# Patient Record
Sex: Female | Born: 1960 | Race: White | Hispanic: No | Marital: Single | Smoking: Former smoker
Health system: Southern US, Community
[De-identification: ages and names within clinical notes are randomized; demographics above are authoritative.]

## PROBLEM LIST (undated history)

## (undated) HISTORY — PX: SINUS EXPLORATION: SHX5214

## (undated) HISTORY — PX: ABDOMINAL HYSTERECTOMY: SHX81

---

## 1998-02-04 ENCOUNTER — Ambulatory Visit (HOSPITAL_COMMUNITY): Admission: RE | Admit: 1998-02-04 | Discharge: 1998-02-04 | Payer: Self-pay

## 2006-05-10 ENCOUNTER — Emergency Department (HOSPITAL_COMMUNITY): Admission: EM | Admit: 2006-05-10 | Discharge: 2006-05-10 | Payer: Self-pay | Admitting: Emergency Medicine

## 2007-01-12 ENCOUNTER — Emergency Department (HOSPITAL_COMMUNITY): Admission: EM | Admit: 2007-01-12 | Discharge: 2007-01-13 | Payer: Self-pay | Admitting: Emergency Medicine

## 2014-05-09 ENCOUNTER — Ambulatory Visit (INDEPENDENT_AMBULATORY_CARE_PROVIDER_SITE_OTHER): Payer: 59

## 2014-05-09 ENCOUNTER — Ambulatory Visit (INDEPENDENT_AMBULATORY_CARE_PROVIDER_SITE_OTHER): Payer: 59 | Admitting: Family Medicine

## 2014-05-09 ENCOUNTER — Encounter (INDEPENDENT_AMBULATORY_CARE_PROVIDER_SITE_OTHER): Payer: Self-pay

## 2014-05-09 VITALS — BP 108/69 | HR 80 | Temp 97.7°F | Ht 61.5 in | Wt 165.0 lb

## 2014-05-09 DIAGNOSIS — S62609A Fracture of unspecified phalanx of unspecified finger, initial encounter for closed fracture: Secondary | ICD-10-CM

## 2014-05-09 DIAGNOSIS — M79609 Pain in unspecified limb: Secondary | ICD-10-CM

## 2014-05-09 DIAGNOSIS — M79641 Pain in right hand: Secondary | ICD-10-CM

## 2014-05-09 DIAGNOSIS — S62639A Displaced fracture of distal phalanx of unspecified finger, initial encounter for closed fracture: Secondary | ICD-10-CM

## 2014-05-09 MED ORDER — ONDANSETRON 8 MG PO TBDP: 8.0000 mg | ORAL_TABLET | Freq: Three times a day (TID) | ORAL | Status: DC | PRN

## 2014-05-09 MED ORDER — HYDROCODONE-ACETAMINOPHEN 5-325 MG PO TABS: 1.0000 | ORAL_TABLET | Freq: Four times a day (QID) | ORAL | Status: DC | PRN

## 2014-05-09 NOTE — Progress Notes (Signed)
   Subjective:    Patient ID: Sheri PancoastKaren Mason, female    DOB: 1960-11-27, 53 y.o.   MRN: [id number]  HPI This 53 y.o. female presents for evaluation of right hand injury.  She was using a power tool to Remove paint from her house and it got caught in her clothing and injured her right 5th finger.   Review of Systems C/o right hand pain No chest pain, SOB, HA, dizziness, vision change, N/V, diarrhea, constipation, dysuria, urinary urgency or frequency,  or rash.     Objective:   Physical Exam Vital signs noted  Well developed well nourished female.  HEENT - Head atraumatic Normocephalic Respiratory - Lungs CTA bilateral Cardiac - RRR S1 and S2 without murmur Right hand - ecchymosis and swelling right 5th finger and hand  Xray of right hand - transverse fx of 5th MIP  Prelimnary reading by Angeline SlimWilliam Oxford,FNP     Assessment & Plan:  Right hand pain - Plan: DG Hand Complete Right, Ambulatory referral to Orthopedic Surgery, ondansetron (ZOFRAN ODT) 8 MG disintegrating tablet, HYDROcodone-acetaminophen (NORCO) 5-325 MG per tablet  Right 5th finger fracture - Refer to orthopedics ASAP and appointment made for 1330 with [facility].  Deatra CanterWilliam J Oxford FNP

## 2014-12-22 ENCOUNTER — Telehealth: Payer: Self-pay | Admitting: Family Medicine

## 2014-12-22 NOTE — Telephone Encounter (Signed)
Patient aware that at this point the tamiflu will not help at this point. Continue to push fluids, use tylenol or ibuprofen for body aches and fever and to call us back if they do not improve.

## 2015-08-25 ENCOUNTER — Encounter: Payer: Self-pay | Admitting: Family Medicine

## 2015-08-25 ENCOUNTER — Ambulatory Visit (INDEPENDENT_AMBULATORY_CARE_PROVIDER_SITE_OTHER): Payer: 59 | Admitting: Family Medicine

## 2015-08-25 VITALS — BP 141/90 | HR 135 | Temp 99.4°F | Ht 61.5 in | Wt 165.0 lb

## 2015-08-25 DIAGNOSIS — R55 Syncope and collapse: Secondary | ICD-10-CM

## 2015-08-25 DIAGNOSIS — R05 Cough: Secondary | ICD-10-CM | POA: Diagnosis not present

## 2015-08-25 DIAGNOSIS — J40 Bronchitis, not specified as acute or chronic: Secondary | ICD-10-CM | POA: Diagnosis not present

## 2015-08-25 DIAGNOSIS — N390 Urinary tract infection, site not specified: Secondary | ICD-10-CM

## 2015-08-25 DIAGNOSIS — R509 Fever, unspecified: Secondary | ICD-10-CM

## 2015-08-25 DIAGNOSIS — J209 Acute bronchitis, unspecified: Secondary | ICD-10-CM

## 2015-08-25 DIAGNOSIS — R059 Cough, unspecified: Secondary | ICD-10-CM

## 2015-08-25 DIAGNOSIS — R062 Wheezing: Secondary | ICD-10-CM | POA: Diagnosis not present

## 2015-08-25 DIAGNOSIS — R319 Hematuria, unspecified: Secondary | ICD-10-CM | POA: Diagnosis not present

## 2015-08-25 LAB — POCT UA - MICROSCOPIC ONLY
Bacteria, U Microscopic: NEGATIVE
CRYSTALS, UR, HPF, POC: NEGATIVE
Casts, Ur, LPF, POC: NEGATIVE
Mucus, UA: NEGATIVE

## 2015-08-25 LAB — POCT URINALYSIS DIPSTICK
Bilirubin, UA: NEGATIVE
GLUCOSE UA: NEGATIVE
Ketones, UA: NEGATIVE
NITRITE UA: NEGATIVE
PH UA: 6
Protein, UA: NEGATIVE
SPEC GRAV UA: 1.01
Urobilinogen, UA: NEGATIVE

## 2015-08-25 LAB — POCT INFLUENZA A/B
INFLUENZA A, POC: NEGATIVE
INFLUENZA B, POC: NEGATIVE

## 2015-08-25 LAB — POCT RAPID STREP A (OFFICE): Rapid Strep A Screen: NEGATIVE

## 2015-08-25 MED ORDER — ALBUTEROL SULFATE HFA 108 (90 BASE) MCG/ACT IN AERS: 2.0000 | INHALATION_SPRAY | Freq: Four times a day (QID) | RESPIRATORY_TRACT | Status: DC | PRN

## 2015-08-25 MED ORDER — PREDNISONE 10 MG PO TABS: ORAL_TABLET | ORAL | Status: DC

## 2015-08-25 MED ORDER — HYDROCOD POLST-CPM POLST ER 10-8 MG/5ML PO SUER: ORAL | Status: DC

## 2015-08-25 MED ORDER — METHYLPREDNISOLONE ACETATE 80 MG/ML IJ SUSP
60.0000 mg | Freq: Once | INTRAMUSCULAR | Status: AC
  Administered 2015-08-25: 60 mg via INTRAMUSCULAR

## 2015-08-25 MED ORDER — TRIAMCINOLONE ACETONIDE 0.1 % EX CREA: 1.0000 "application " | TOPICAL_CREAM | Freq: Two times a day (BID) | CUTANEOUS | Status: DC

## 2015-08-25 MED ORDER — SULFAMETHOXAZOLE-TRIMETHOPRIM 800-160 MG PO TABS: ORAL_TABLET | ORAL | Status: DC

## 2015-08-25 NOTE — Progress Notes (Signed)
Subjective:    Patient ID: Sheri Mason, female    DOB: May 27, 1961, 54 y.o.   MRN: [id number]  HPI  Patient here tonight for cough, sore throat, fainting spell and fever. This all started on Saturday which was 3 days ago. The patient has yellow congestion and drainage along with fatigue. The fainting spell was this morning. The patient comes to the visit with her son. She has trouble talking and taking deep breaths without coughing. She is not having any urinary tract or gastrointestinal symptoms.       There are no active problems to display for this patient.  Outpatient Encounter Prescriptions as of 08/25/2015  Medication Sig  . [DISCONTINUED] HYDROcodone-acetaminophen (NORCO) 5-325 MG per tablet Take 1 tablet by mouth every 6 (six) hours as needed for moderate pain.  . [DISCONTINUED] ondansetron (ZOFRAN ODT) 8 MG disintegrating tablet Take 1 tablet (8 mg total) by mouth every 8 (eight) hours as needed for nausea or vomiting.  . [DISCONTINUED] phentermine 37.5 MG capsule Take 37.5 mg by mouth every morning.   No facility-administered encounter medications on file as of 08/25/2015.     Review of Systems  Constitutional: Positive for fever and fatigue.  HENT: Positive for congestion (yellow), postnasal drip and sore throat.   Eyes: Negative.   Respiratory: Positive for cough.   Cardiovascular: Negative.   Gastrointestinal: Negative.   Endocrine: Negative.   Genitourinary: Negative.   Musculoskeletal: Negative.   Skin: Negative.   Allergic/Immunologic: Negative.   Neurological: Negative.        Fainting spell this morning  Hematological: Negative.   Psychiatric/Behavioral: Negative.        Objective:   Physical Exam  Constitutional: She is oriented to person, place, and time. She appears well-developed and well-nourished. She appears distressed.  HENT:  Head: Normocephalic and atraumatic.  Right Ear: External ear normal.  Left Ear: External ear normal.  Mouth/Throat:  Oropharynx is clear and moist.  Maxillary sinus tenderness  Eyes: Conjunctivae and EOM are normal. Pupils are equal, round, and reactive to light. Right eye exhibits no discharge. Left eye exhibits no discharge. No scleral icterus.  Neck: Normal range of motion. Neck supple. No thyromegaly present.  No anterior cervical adenopathy  Cardiovascular: Normal rate, regular rhythm and normal heart sounds.   No murmur heard. 96/m with a regular rate and rhythm  Pulmonary/Chest: Effort normal. No respiratory distress. She has wheezes. She has no rales. She exhibits no tenderness.  Inspiratory and expiratory wheezing and rhonchi  Abdominal: Soft. Bowel sounds are normal. She exhibits no mass. There is no tenderness. There is no rebound and no guarding.  Musculoskeletal: Normal range of motion. She exhibits no edema.  Lymphadenopathy:    She has no cervical adenopathy.  Neurological: She is alert and oriented to person, place, and time.  Skin: Skin is warm and dry. No rash noted.  Psychiatric: She has a normal mood and affect. Her behavior is normal. Judgment and thought content normal.  Nursing note and vitals reviewed.   BP 141/90 mmHg  Pulse 135  Temp(Src) 99.4 F (37.4 C) (Oral)  Ht 5' 1.5" (1.562 m)  Wt 165 lb (74.844 kg)  BMI 30.68 kg/m2  Results for orders placed or performed in visit on 08/25/15  POCT urinalysis dipstick  Result Value Ref Range   Color, UA yellow    Clarity, UA cloudy    Glucose, UA neg    Bilirubin, UA neg    Ketones, UA neg  Spec Grav, UA 1.010    Blood, UA large    pH, UA 6.0    Protein, UA neg    Urobilinogen, UA negative    Nitrite, UA neg    Leukocytes, UA large (3+) (A) Negative  POCT UA - Microscopic Only  Result Value Ref Range   WBC, Ur, HPF, POC 25-30    RBC, urine, microscopic 5-10    Bacteria, U Microscopic neg    Mucus, UA neg    Epithelial cells, urine per micros occ    Crystals, Ur, HPF, POC neg    Casts, Ur, LPF, POC neg    Yeast,  UA mod   POCT Influenza A/B  Result Value Ref Range   Influenza A, POC Negative Negative   Influenza B, POC Negative Negative  POCT rapid strep A  Result Value Ref Range   Rapid Strep A Screen Negative Negative   The patient was informed of the above results before she left the office     Assessment & Plan:  1. Fainting spell -Drink plenty of fluids and stay well hydrated and get plenty of rest - BMP8+EGFR - CBC with Differential/Platelet - Hepatic function panel - POCT urinalysis dipstick - POCT UA - Microscopic Only - Urine culture  2. Fever, unspecified fever cause -Take Tylenol for aches pains and fever - BMP8+EGFR - CBC with Differential/Platelet - Hepatic function panel - POCT urinalysis dipstick - POCT UA - Microscopic Only - POCT Influenza A/B - POCT rapid strep A - Culture, Group A Strep - Urine culture - sulfamethoxazole-trimethoprim (BACTRIM DS,SEPTRA DS) 800-160 MG tablet; 1 tablet twice daily with food for infection until completed  Dispense: 20 tablet; Refill: 0  3. Bronchitis with bronchospasm -Use inhaler 1-2 puffs 4 times daily as needed for wheezing and chest tightness -Continue to take Mucinex maximum strength, blue and white in color, 1 twice daily for cough and congestion with a large glass of water - sulfamethoxazole-trimethoprim (BACTRIM DS,SEPTRA DS) 800-160 MG tablet; 1 tablet twice daily with food for infection until completed  Dispense: 20 tablet; Refill: 0  4. Wheezing -Drink plenty of fluids and stay well hydrated and use inhaler as directed - albuterol (PROVENTIL HFA;VENTOLIN HFA) 108 (90 BASE) MCG/ACT inhaler; Inhale 2 puffs into the lungs every 6 (six) hours as needed for wheezing or shortness of breath.  Dispense: 1 Inhaler; Refill: 2 - predniSONE (DELTASONE) 10 MG tablet; 1 tablet 4 times a day for 2 days,  1 tablet 3 times a day for 2 days,  1 tablet 2 times a day for 2 days, 1 tablet daily for 2 days  Dispense: 20 tablet; Refill: 0 -  methylPREDNISolone acetate (DEPO-MEDROL) injection 60 mg; Inject 0.75 mLs (60 mg total) into the muscle once.  5. Cough -Take Mucinex and use a stronger cough medicine for nighttime use if needed for severe cough - chlorpheniramine-HYDROcodone (TUSSIONEX PENNKINETIC ER) 10-8 MG/5ML SUER; 1 teaspoon at bedtime if needed for severe cough  Dispense: 100 mL; Refill: 0  6. Urinary tract infection with hematuria, site unspecified -Take antibiotic as directed and drink plenty of fluids - POCT urinalysis dipstick - POCT UA - Microscopic Only  Patient Instructions  Take antibiotic as directed with food Drink plenty of fluids and stay well hydrated Take Tylenol for aches pains and fever Take prednisone as directed Take Mucinex maximum strength blue and white in color, 1 twice daily for cough and congestion with a large glass of water Use nasal saline frequently in each  nostril Take stronger cough medicine at nighttime if needed for severe cough Use inhaler as needed for wheezing   Arrie Senate MD

## 2015-08-25 NOTE — Patient Instructions (Signed)
Take antibiotic as directed with food Drink plenty of fluids and stay well hydrated Take Tylenol for aches pains and fever Take prednisone as directed Take Mucinex maximum strength blue and white in color, 1 twice daily for cough and congestion with a large glass of water Use nasal saline frequently in each nostril Take stronger cough medicine at nighttime if needed for severe cough Use inhaler as needed for wheezing

## 2015-08-26 LAB — CBC WITH DIFFERENTIAL/PLATELET
BASOS ABS: 0 10*3/uL (ref 0.0–0.2)
BASOS: 0 %
EOS (ABSOLUTE): 0.1 10*3/uL (ref 0.0–0.4)
Eos: 2 %
Hematocrit: 41 % (ref 34.0–46.6)
Hemoglobin: 14.1 g/dL (ref 11.1–15.9)
IMMATURE GRANULOCYTES: 0 %
Immature Grans (Abs): 0 10*3/uL (ref 0.0–0.1)
Lymphocytes Absolute: 1.1 10*3/uL (ref 0.7–3.1)
Lymphs: 19 %
MCH: 31 pg (ref 26.6–33.0)
MCHC: 34.4 g/dL (ref 31.5–35.7)
MCV: 90 fL (ref 79–97)
MONOS ABS: 0.4 10*3/uL (ref 0.1–0.9)
Monocytes: 7 %
NEUTROS PCT: 72 %
Neutrophils Absolute: 4.4 10*3/uL (ref 1.4–7.0)
PLATELETS: 206 10*3/uL (ref 150–379)
RBC: 4.55 x10E6/uL (ref 3.77–5.28)
RDW: 13.6 % (ref 12.3–15.4)
WBC: 6 10*3/uL (ref 3.4–10.8)

## 2015-08-26 LAB — HEPATIC FUNCTION PANEL
ALT: 17 IU/L (ref 0–32)
AST: 20 IU/L (ref 0–40)
Albumin: 4.6 g/dL (ref 3.5–5.5)
Alkaline Phosphatase: 95 IU/L (ref 39–117)
BILIRUBIN TOTAL: 0.4 mg/dL (ref 0.0–1.2)
Bilirubin, Direct: 0.1 mg/dL (ref 0.00–0.40)
Total Protein: 7.3 g/dL (ref 6.0–8.5)

## 2015-08-26 LAB — BMP8+EGFR
BUN/Creatinine Ratio: 15 (ref 9–23)
BUN: 9 mg/dL (ref 6–24)
CHLORIDE: 99 mmol/L (ref 97–106)
CO2: 23 mmol/L (ref 18–29)
Calcium: 9.5 mg/dL (ref 8.7–10.2)
Creatinine, Ser: 0.62 mg/dL (ref 0.57–1.00)
GFR calc Af Amer: 118 mL/min/{1.73_m2} (ref >59)
GFR calc non Af Amer: 103 mL/min/{1.73_m2} (ref >59)
GLUCOSE: 100 mg/dL — AB (ref 65–99)
POTASSIUM: 3.9 mmol/L (ref 3.5–5.2)
SODIUM: 140 mmol/L (ref 136–144)

## 2015-08-27 LAB — CULTURE, GROUP A STREP: Strep A Culture: NEGATIVE

## 2015-08-27 LAB — URINE CULTURE

## 2015-08-28 NOTE — Progress Notes (Signed)
Lmtcb/ww 11/18  

## 2015-09-01 ENCOUNTER — Ambulatory Visit (INDEPENDENT_AMBULATORY_CARE_PROVIDER_SITE_OTHER): Payer: 59 | Admitting: Family Medicine

## 2015-09-01 ENCOUNTER — Encounter: Payer: Self-pay | Admitting: Family Medicine

## 2015-09-01 ENCOUNTER — Ambulatory Visit (INDEPENDENT_AMBULATORY_CARE_PROVIDER_SITE_OTHER): Payer: 59

## 2015-09-01 VITALS — BP 124/75 | HR 78 | Temp 99.1°F | Ht 61.5 in | Wt 167.0 lb

## 2015-09-01 DIAGNOSIS — R509 Fever, unspecified: Secondary | ICD-10-CM

## 2015-09-01 DIAGNOSIS — R300 Dysuria: Secondary | ICD-10-CM

## 2015-09-01 DIAGNOSIS — R059 Cough, unspecified: Secondary | ICD-10-CM

## 2015-09-01 DIAGNOSIS — R05 Cough: Secondary | ICD-10-CM

## 2015-09-01 DIAGNOSIS — R8281 Pyuria: Secondary | ICD-10-CM

## 2015-09-01 DIAGNOSIS — J209 Acute bronchitis, unspecified: Secondary | ICD-10-CM

## 2015-09-01 DIAGNOSIS — R062 Wheezing: Secondary | ICD-10-CM | POA: Diagnosis not present

## 2015-09-01 DIAGNOSIS — R1030 Lower abdominal pain, unspecified: Secondary | ICD-10-CM

## 2015-09-01 DIAGNOSIS — J40 Bronchitis, not specified as acute or chronic: Secondary | ICD-10-CM | POA: Diagnosis not present

## 2015-09-01 DIAGNOSIS — R319 Hematuria, unspecified: Secondary | ICD-10-CM | POA: Diagnosis not present

## 2015-09-01 DIAGNOSIS — N390 Urinary tract infection, site not specified: Secondary | ICD-10-CM | POA: Diagnosis not present

## 2015-09-01 LAB — POCT URINALYSIS DIPSTICK
BILIRUBIN UA: NEGATIVE
GLUCOSE UA: NEGATIVE
KETONES UA: NEGATIVE
Leukocytes, UA: NEGATIVE
Nitrite, UA: NEGATIVE
Protein, UA: NEGATIVE
RBC UA: NEGATIVE
UROBILINOGEN UA: NEGATIVE
pH, UA: 5

## 2015-09-01 LAB — POCT UA - MICROSCOPIC ONLY
BACTERIA, U MICROSCOPIC: NEGATIVE
CASTS, UR, LPF, POC: NEGATIVE
CRYSTALS, UR, HPF, POC: NEGATIVE
MUCUS UA: NEGATIVE
RBC, URINE, MICROSCOPIC: NEGATIVE
WBC, Ur, HPF, POC: NEGATIVE

## 2015-09-01 MED ORDER — HYDROCOD POLST-CPM POLST ER 10-8 MG/5ML PO SUER: ORAL | Status: DC

## 2015-09-01 MED ORDER — LEVOFLOXACIN 500 MG PO TABS: 500.0000 mg | ORAL_TABLET | Freq: Every day | ORAL | Status: DC

## 2015-09-01 NOTE — Progress Notes (Signed)
Subjective:    Patient ID: Sheri PancoastKaren Mason, female    DOB: 01/30/1961, 54 y.o.   MRN: [id number]  HPI Patient here today for lingering cough but getting better. The patient is finishing her prednisone and still taking her Septra DS. She is still coughing and wheezing and she did get the prescription for stronger cough medicine to take at nighttime. She is actually having more suprapubic pressure that she was having the night that she was seen. All of her recent lab work was reviewed with her including a normal white blood cell count the urinalysis that had 25-30 WBC and the urine culture that showed mixed bacterial flora with no specific bacteria to treat. She did not come back to get the chest x-ray as was planned because of the expense with all the medicines. She says if she is coughing up some sputum still haven't may have slightly yellow tinge.  There are no active problems to display for this patient.  Outpatient Encounter Prescriptions as of 09/01/2015  Medication Sig  . albuterol (PROVENTIL HFA;VENTOLIN HFA) 108 (90 BASE) MCG/ACT inhaler Inhale 2 puffs into the lungs every 6 (six) hours as needed for wheezing or shortness of breath.  . chlorpheniramine-HYDROcodone (TUSSIONEX PENNKINETIC ER) 10-8 MG/5ML SUER 1 teaspoon at bedtime if needed for severe cough  . predniSONE (DELTASONE) 10 MG tablet 1 tablet 4 times a day for 2 days,  1 tablet 3 times a day for 2 days,  1 tablet 2 times a day for 2 days, 1 tablet daily for 2 days  . sulfamethoxazole-trimethoprim (BACTRIM DS,SEPTRA DS) 800-160 MG tablet 1 tablet twice daily with food for infection until completed  . triamcinolone cream (KENALOG) 0.1 % Apply 1 application topically 2 (two) times daily.   No facility-administered encounter medications on file as of 09/01/2015.       Review of Systems  Constitutional: Negative.   HENT: Negative.   Eyes: Negative.   Respiratory: Positive for cough (lingering cough, but better).     Cardiovascular: Negative.   Gastrointestinal: Negative.   Endocrine: Negative.   Genitourinary: Negative.   Musculoskeletal: Negative.   Skin: Negative.   Allergic/Immunologic: Negative.   Neurological: Negative.   Hematological: Negative.   Psychiatric/Behavioral: Negative.          Objective:   Physical Exam  Constitutional: She is oriented to person, place, and time. She appears well-developed and well-nourished. No distress.  HENT:  Head: Normocephalic and atraumatic.  Right Ear: External ear normal.  Left Ear: External ear normal.  Nose: Nose normal.  Mouth/Throat: No oropharyngeal exudate.  The throat is slightly red posteriorly  Eyes: Conjunctivae and EOM are normal. Pupils are equal, round, and reactive to light. Right eye exhibits no discharge. Left eye exhibits no discharge. No scleral icterus.  Neck: Normal range of motion. Neck supple. No JVD present. No thyromegaly present.  Cardiovascular: Normal rate, regular rhythm and normal heart sounds.   No murmur heard. Pulmonary/Chest: Effort normal. No respiratory distress. She has wheezes. She has no rales. She exhibits no tenderness.  The patient cannot inhale deeply without coughing. She has a raspy cough and wheezes on expiration.  Abdominal: Soft. Bowel sounds are normal. She exhibits no mass. There is no tenderness. There is no rebound and no guarding.  There is minimal suprapubic tenderness with normal bowel sounds and no bruits and no organ enlargement or masses  Musculoskeletal: Normal range of motion. She exhibits no edema.  Lymphadenopathy:    She has no cervical  adenopathy.  Neurological: She is alert and oriented to person, place, and time.  Skin: Skin is warm and dry. No rash noted.  Psychiatric: She has a normal mood and affect. Her behavior is normal. Judgment and thought content normal.  Nursing note and vitals reviewed.  BP 124/75 mmHg  Pulse 78  Temp(Src) 99.1 F (37.3 C) (Oral)  Ht 5' 1.5"  (1.562 m)  Wt 167 lb (75.751 kg)  BMI 31.05 kg/m2  [facility] reading (PRIMARY) by  Dr. Tracie Harrier x-ray-  according to the radiologist and pleural effusion cannot be excluded or atelectasis.                             Results for orders placed or performed in visit on 09/01/15  POCT UA - Microscopic Only  Result Value Ref Range   WBC, Ur, HPF, POC neg    RBC, urine, microscopic neg    Bacteria, U Microscopic neg    Mucus, UA neg    Epithelial cells, urine per micros occ    Crystals, Ur, HPF, POC neg    Casts, Ur, LPF, POC neg    Yeast, UA moderate   POCT urinalysis dipstick  Result Value Ref Range   Color, UA yellow    Clarity, UA cloudy    Glucose, UA neg    Bilirubin, UA neg    Ketones, UA neg    Spec Grav, UA >=1.030    Blood, UA neg    pH, UA 5.0    Protein, UA neg    Urobilinogen, UA negative    Nitrite, UA neg    Leukocytes, UA Negative Negative          Assessment & Plan:  1. Suprapubic pressure, unspecified laterality -Since the urinalysis was clear today and the patient is not having any overt symptoms with her voiding, she will arrange to see the gynecologist for the possibility of prolapse.  2. Bronchitis with bronchospasm -Discontinue Septra and start Levaquin 500 one daily for 7 days -Finish prednisone -Continue to drink plenty of fluids and use albuterol inhaler as a rescue inhaler and use the Brio inhaler 1 puff daily and rinse mouth after using  3. Pyuria -The urinalysis has cleared  4. Urinary tract infection with hematuria, site unspecified -Urinalysis is clear today even though she continues to have pelvic pressure  5. Dysuria -No symptoms with voiding now - POCT UA - Microscopic Only - POCT urinalysis dipstick  6. Cough -Continue with Mucinex and/or codeine cough medicine if needed for severe cough -Drink plenty of fluids - chlorpheniramine-HYDROcodone (TUSSIONEX PENNKINETIC ER) 10-8 MG/5ML SUER; 1 teaspoon at bedtime if needed for severe  cough  Dispense: 100 mL; Refill: 0  Meds ordered this encounter  Medications  . levofloxacin (LEVAQUIN) 500 MG tablet    Sig: Take 1 tablet (500 mg total) by mouth daily.    Dispense:  7 tablet    Refill:  0  . chlorpheniramine-HYDROcodone (TUSSIONEX PENNKINETIC ER) 10-8 MG/5ML SUER    Sig: 1 teaspoon at bedtime if needed for severe cough    Dispense:  100 mL    Refill:  0   Patient Instructions   Continue to drink plenty of fluids Continue to use rescue inhaler or albuterol as needed Continue to take Mucinex twice daily with a large glass of water for cough Continue to use nasal saline Use stronger cough medicine at nighttime for severe cough  Stop the sulfa  and start Levaquin and Breo inhaler. The patient was also instructed to make an appointment with her gynecologist because of the pelvic pressure that she is experiencing since she has had the cough She will return to the office in one week for recheck to follow-up on the bronchitis and bronchospasm   Nyra Capes MD

## 2015-09-01 NOTE — Patient Instructions (Addendum)
  Continue to drink plenty of fluids Continue to use rescue inhaler or albuterol as needed Continue to take Mucinex twice daily with a large glass of water for cough Continue to use nasal saline Use stronger cough medicine at nighttime for severe cough  Stop the sulfa and start Levaquin and Breo inhaler. The patient was also instructed to make an appointment with her gynecologist because of the pelvic pressure that she is experiencing since she has had the cough She will return to the office in one week for recheck to follow-up on the bronchitis and bronchospasm

## 2015-09-10 ENCOUNTER — Encounter: Payer: Self-pay | Admitting: Family Medicine

## 2015-09-10 ENCOUNTER — Ambulatory Visit (INDEPENDENT_AMBULATORY_CARE_PROVIDER_SITE_OTHER): Payer: 59 | Admitting: Family Medicine

## 2015-09-10 VITALS — BP 124/85 | HR 62 | Temp 97.1°F | Ht 61.5 in | Wt 170.8 lb

## 2015-09-10 DIAGNOSIS — J189 Pneumonia, unspecified organism: Secondary | ICD-10-CM

## 2015-09-10 DIAGNOSIS — R05 Cough: Secondary | ICD-10-CM | POA: Diagnosis not present

## 2015-09-10 DIAGNOSIS — R059 Cough, unspecified: Secondary | ICD-10-CM

## 2015-09-10 MED ORDER — FLUTICASONE FUROATE-VILANTEROL 100-25 MCG/INH IN AEPB: 1.0000 | INHALATION_SPRAY | Freq: Every day | RESPIRATORY_TRACT | Status: DC

## 2015-09-10 MED ORDER — PREDNISONE 20 MG PO TABS: 40.0000 mg | ORAL_TABLET | Freq: Every day | ORAL | Status: DC

## 2015-09-10 MED ORDER — FLUTICASONE PROPIONATE 50 MCG/ACT NA SUSP: 2.0000 | Freq: Every day | NASAL | Status: DC

## 2015-09-10 NOTE — Progress Notes (Signed)
   HPI  Patient presents today for follow-up for treated for pneumonia and cough.  She feels much better overall but states that her cough is still lingering. The cough has improved but is still annoying. She has some wheezing cough and coughing with activities. She has started breo and finished her prednisone a few weeks ago. She's is albuterol occasionally with good improvement of symptoms   PMH: Smoking status noted ROS: Per HPI  Objective: BP 124/85 mmHg  Pulse 62  Temp(Src) 97.1 F (36.2 C) (Oral)  Ht 5' 1.5" (1.562 m)  Wt 170 lb 12.8 oz (77.474 kg)  BMI 31.75 kg/m2 Gen: NAD, alert, cooperative with exam HEENT: NCAT, nares with some swelling of the turbinates and bluish discoloration bilaterally, cobblestoning of the oropharynx, TMs normal bilaterally CV: RRR, good S1/S2, no murmur Resp: Nonlabored, x-ray wheezes throughout Ext: No edema, warm Neuro: Alert and oriented, No gross deficits  Assessment and plan:  # Community-acquired pneumonia, cough She is now finished with her antibiotics and prednisone, she feels much better but her cough is still lingering. She does have wheezing on exam She has improvement with albuterol and breo, continue breo for 2 additional weeks 7 additional days prednisone Return precautions reviewed She may have underlying asthma, so could consider long-term breo or similar medicine if needed   Meds ordered this encounter  Medications  . predniSONE (DELTASONE) 20 MG tablet    Sig: Take 2 tablets (40 mg total) by mouth daily with breakfast.    Dispense:  14 tablet    Refill:  0  . Fluticasone Furoate-Vilanterol (BREO ELLIPTA) 100-25 MCG/INH AEPB    Sig: Inhale 1 puff into the lungs daily.    Dispense:  1 each    Refill:  0  . fluticasone (FLONASE) 50 MCG/ACT nasal spray    Sig: Place 2 sprays into both nostrils daily.    Dispense:  16 g    Refill:  11    Murtis SinkSam Delson [place], MD [facility] 09/10/2015, 12:02  PM

## 2015-09-10 NOTE — Patient Instructions (Signed)
Great to see you again!  Please come back if things get better or do not improve as expected  Finish the breo, if you are better you can stop  Start flonase 2 times daily for 1 week then daily.

## 2016-05-05 ENCOUNTER — Telehealth: Payer: Self-pay | Admitting: Family Medicine

## 2016-05-31 ENCOUNTER — Other Ambulatory Visit: Payer: Self-pay | Admitting: Family Medicine

## 2016-05-31 DIAGNOSIS — G939 Disorder of brain, unspecified: Secondary | ICD-10-CM

## 2016-06-07 ENCOUNTER — Other Ambulatory Visit: Payer: Self-pay

## 2016-06-07 ENCOUNTER — Ambulatory Visit
Admission: RE | Admit: 2016-06-07 | Discharge: 2016-06-07 | Disposition: A | Discharge status: Home / Self Care | Payer: 59 | Source: Ambulatory Visit | Attending: Family Medicine | Admitting: Family Medicine

## 2016-06-07 DIAGNOSIS — G939 Disorder of brain, unspecified: Secondary | ICD-10-CM

## 2016-06-07 MED ORDER — GADOBENATE DIMEGLUMINE 529 MG/ML IV SOLN
15.0000 mL | Freq: Once | INTRAVENOUS | Status: AC | PRN
  Administered 2016-06-07: 15 mL via INTRAVENOUS

## 2016-11-08 IMAGING — CR DG CHEST 2V
2 series · 2 of 2 positions shown · non-contrast
Comparison: 05/10/2006.

CLINICAL DATA: Cough and congestion.

EXAM:
CHEST  2 VIEW

[view not recorded (1 of 2)]
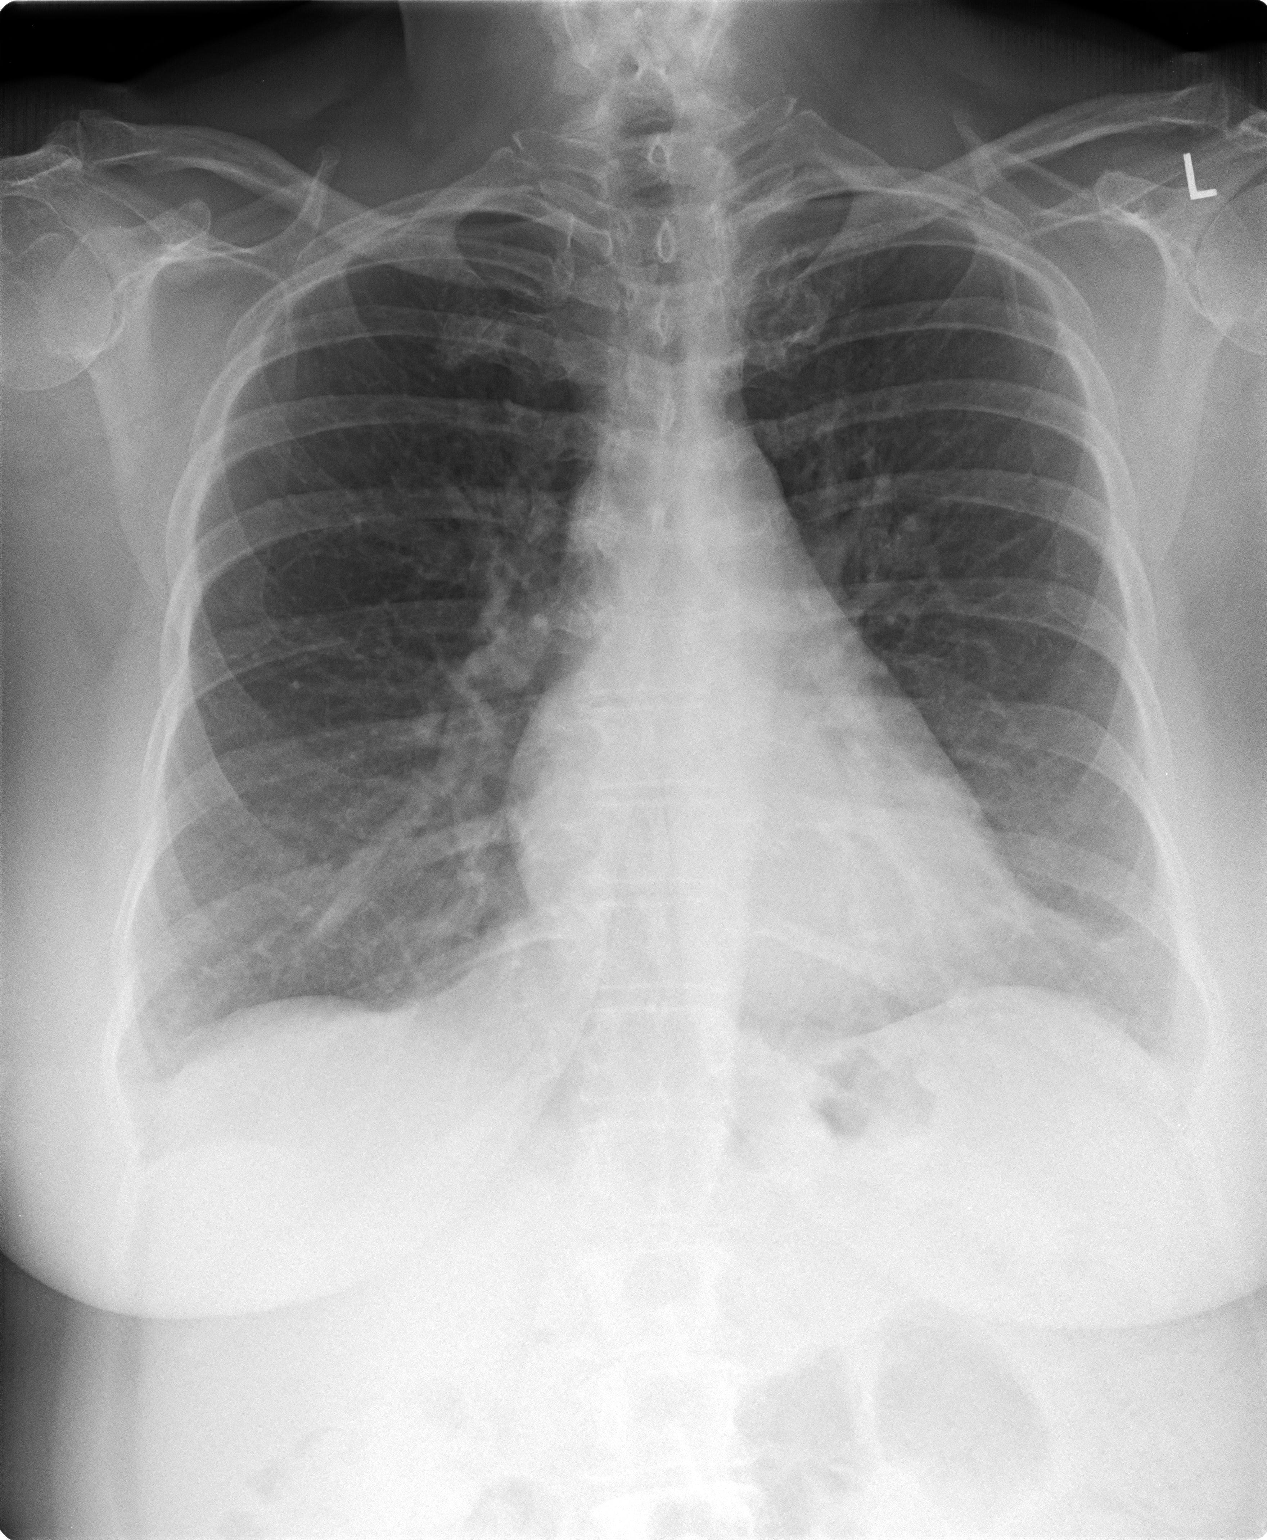

[view not recorded (2 of 2)]
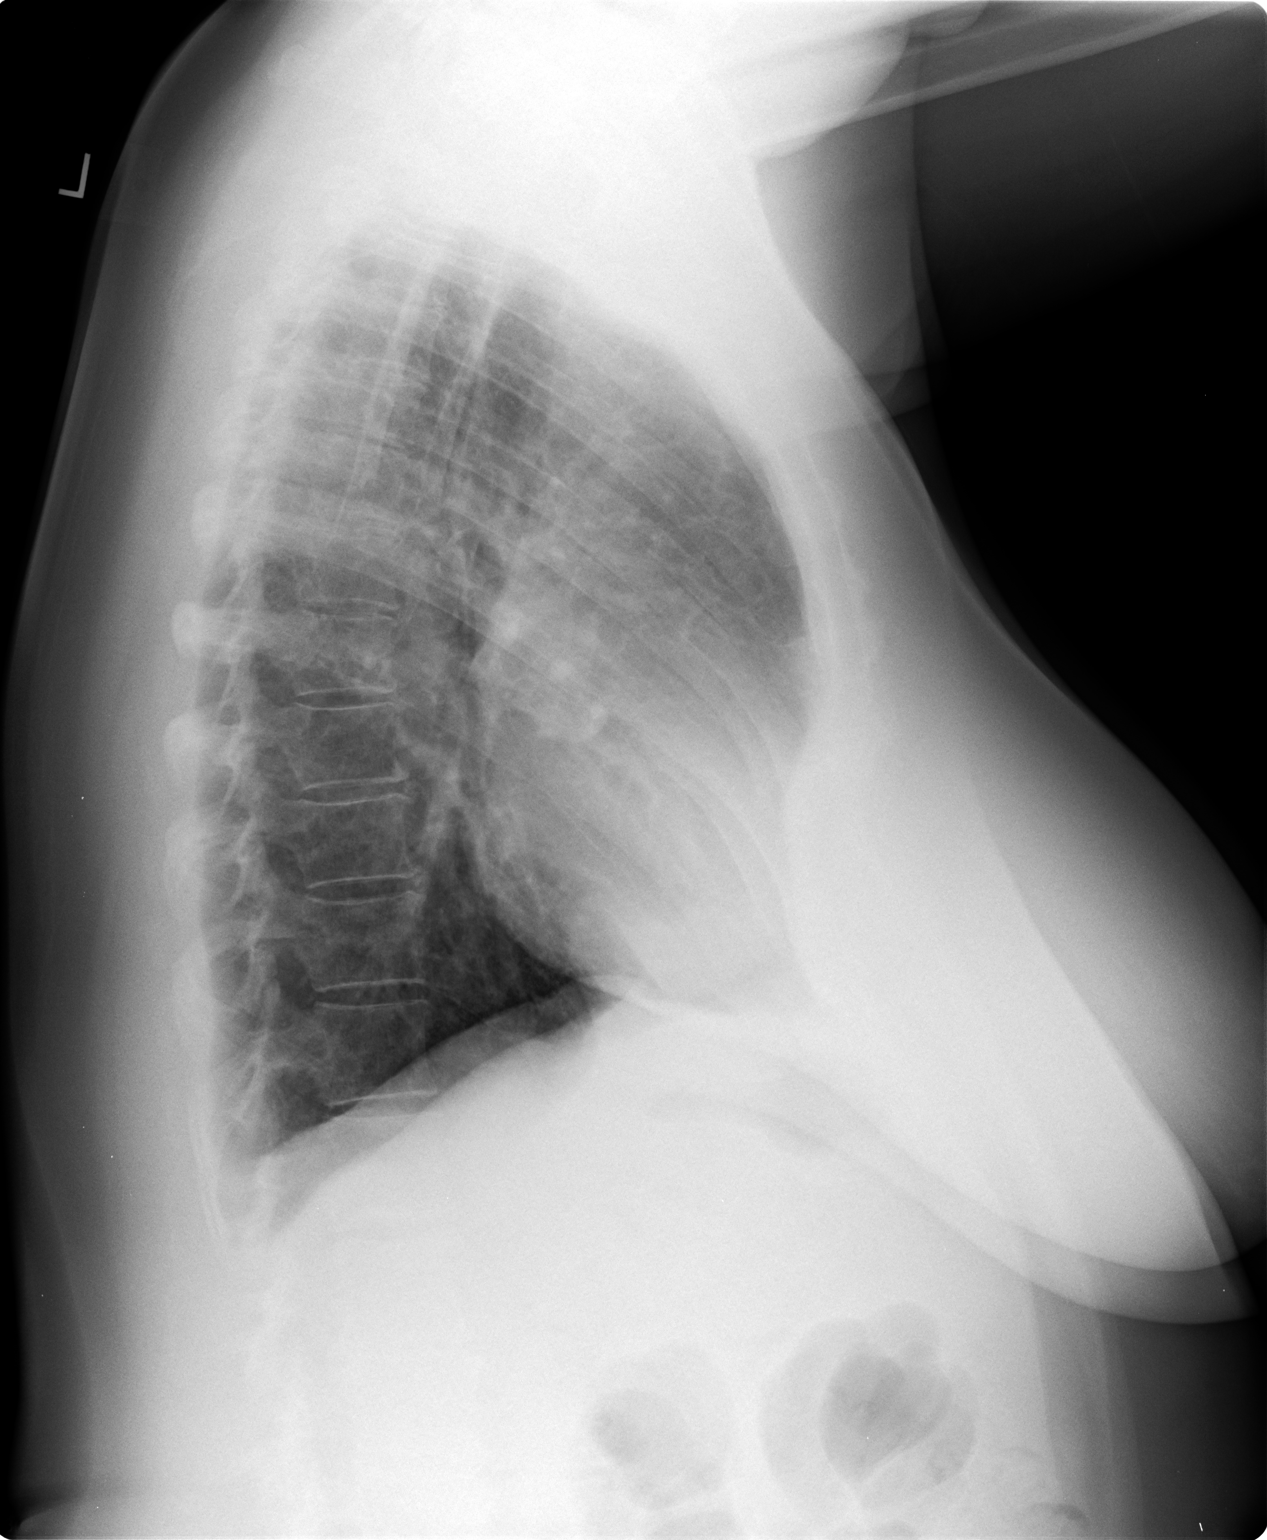

[2 of 2 positions shown; findings below may reference images not displayed]

FINDINGS: Mediastinum and hilar structures normal. Heart size normal.
Subsegmental atelectasis and/or infiltrates are noted in the lung
bases. Small bilateral pleural effusions cannot be excluded .
IMPRESSION: Mild bibasilar subsegmental atelectasis and/or infiltrates with
small pleural effusions cannot be excluded.

## 2017-03-15 ENCOUNTER — Telehealth: Payer: Self-pay | Admitting: Family Medicine

## 2017-08-15 IMAGING — MR MR HEAD WO/W CM
10 of 11 series · 35 of 48 positions shown · IV contrast (15 ML MULTIHANCE)
Comparison: CT head 01/12/2007

CLINICAL DATA: Dizziness, vertigo, and BILATERAL hearing loss,
worse on the LEFT.

EXAM:
MRI HEAD WITHOUT AND WITH CONTRAST
TECHNIQUE: Multiplanar, multiecho pulse sequences of the brain and surrounding
structures were obtained without and with intravenous contrast.
CONTRAST:  15mL MULTIHANCE GADOBENATE DIMEGLUMINE 529 MG/ML IV SOLN

[Series 3: T1 · sagittal · 5.0mm · 0.45mm/px · 4 of 21 slices shown (1 of 3)]
[im 1/21]
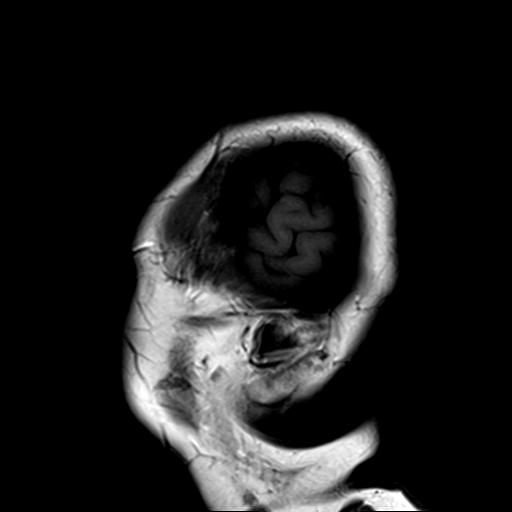
[im 7/21]
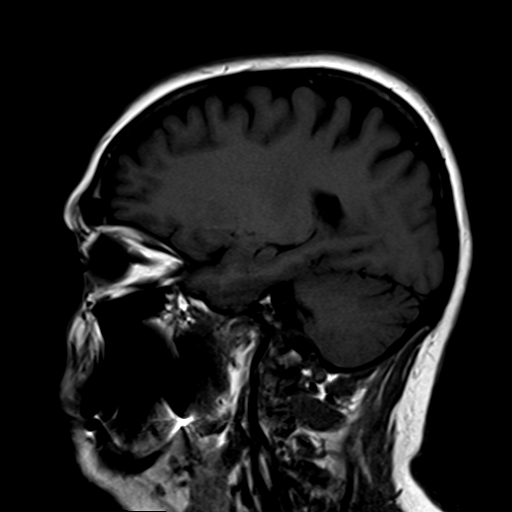
[im 14/21]
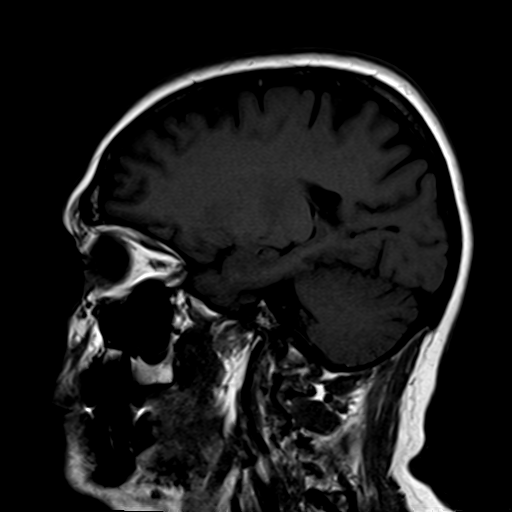
[im 21/21]
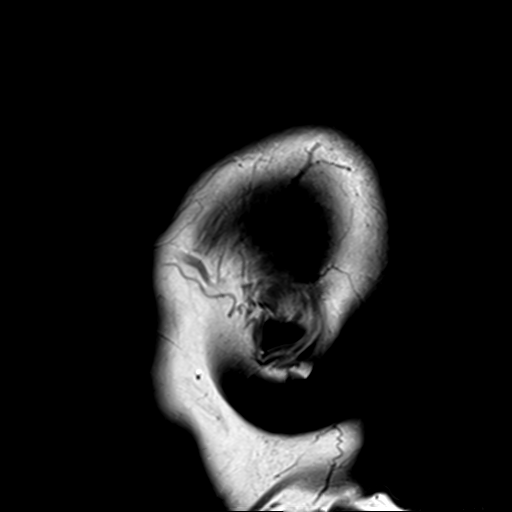

[Series 4: DWI · axial · 3.0mm · 1.80mm/px · z∈[-63,+84]mm · 9 of 100 slices shown (1 of 2)]
[im 1/100]
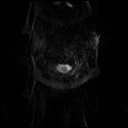
[im 17/100]
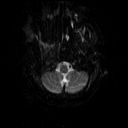
[im 34/100]
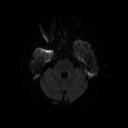
[im 42/100]
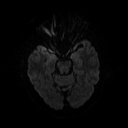
[im 50/100]
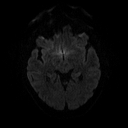
[im 58/100]
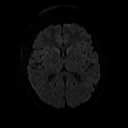
[im 67/100]
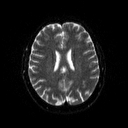
[im 83/100]
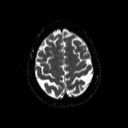
[im 100/100]
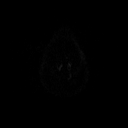

[Series 5: DWI · axial · 3.0mm · 1.80mm/px · z∈[-63,+84]mm · 7 of 50 slices shown (2 of 2)]
[im 1/50]
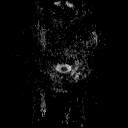
[im 9/50]
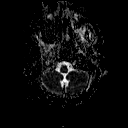
[im 17/50]
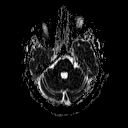
[im 25/50]
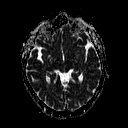
[im 33/50]
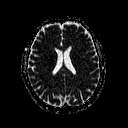
[im 41/50]
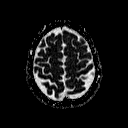
[im 50/50]
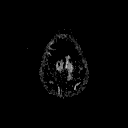

[Series 6: T2 · axial · 5.0mm · 0.60mm/px · z∈[-61,+82]mm · 3 of 23 slices shown]
[im 1/23]
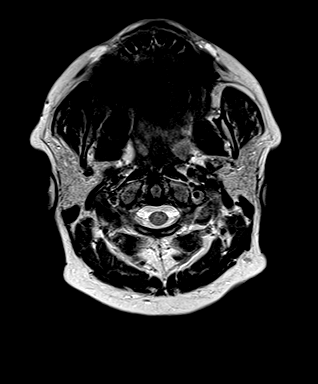
[im 12/23]
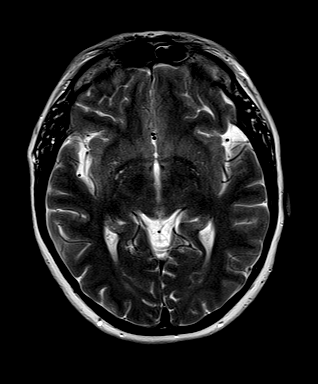
[im 23/23]
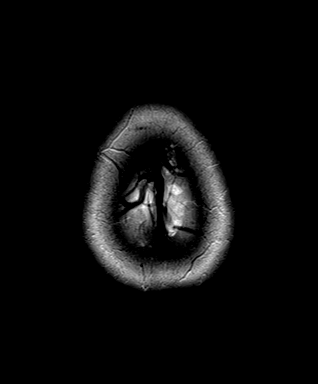

[Series 7: FLAIR · axial · 5.0mm · 0.45mm/px · z∈[-61,+82]mm · 3 of 23 slices shown]
[im 1/23]
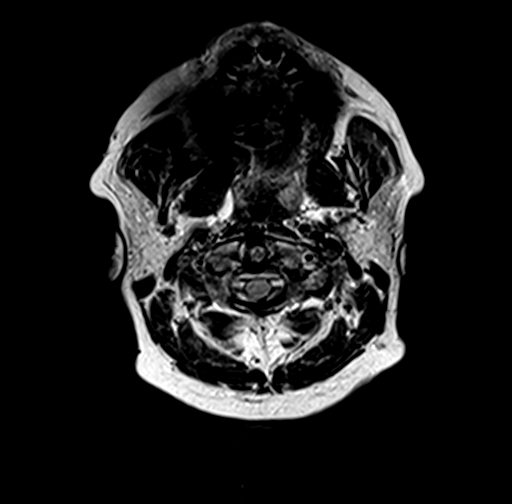
[im 12/23]
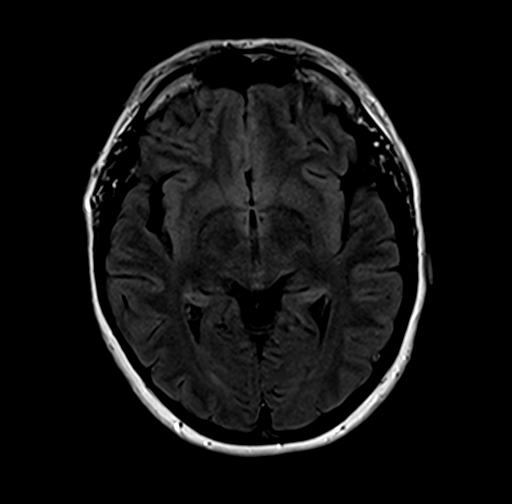
[im 23/23]
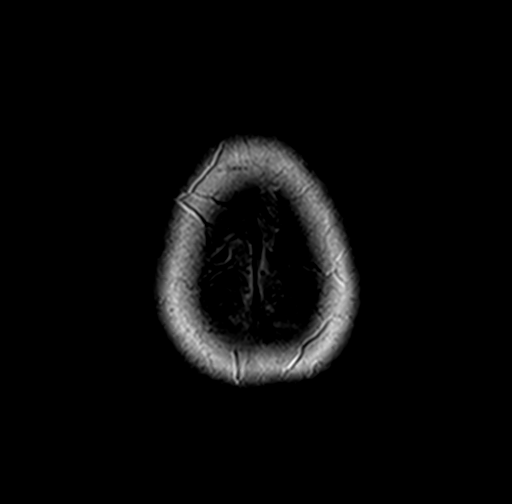

[Series 8: T1 · coronal · 3.0mm · 0.35mm/px · 1 of 11 slices shown (2 of 3)]
[im 1/11]
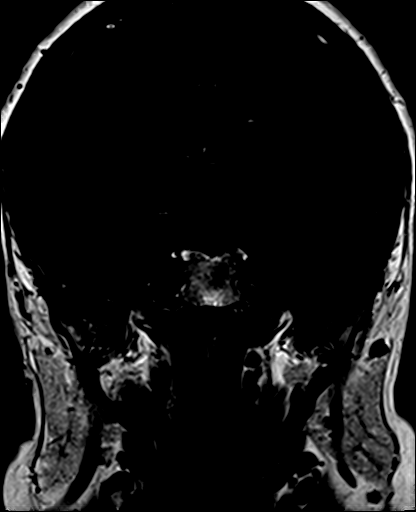

[Series 9: T1 · axial · 3.0mm · 0.35mm/px · 1 of 11 slices shown (3 of 3)]
[im 1/11]
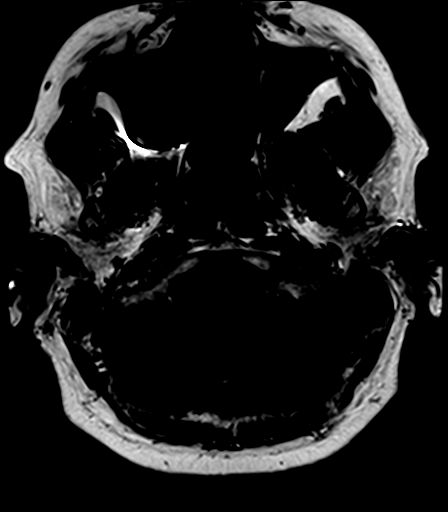

[Series 10: bSSFP · axial · 1.0mm · 0.28mm/px · z∈[-44,-9]mm · 5 of 36 slices shown]
[im 1/36]
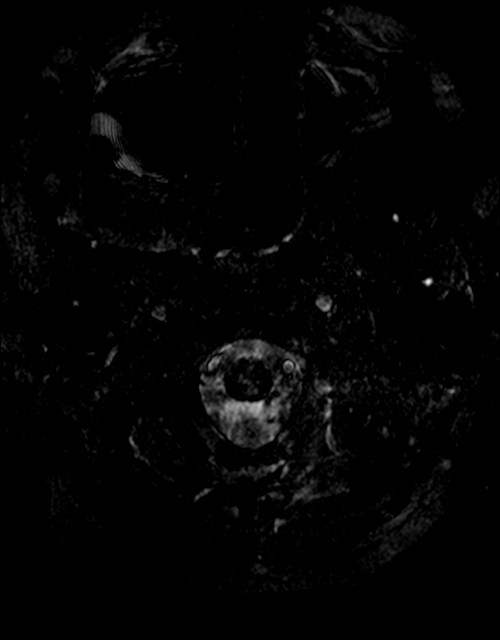
[im 9/36]
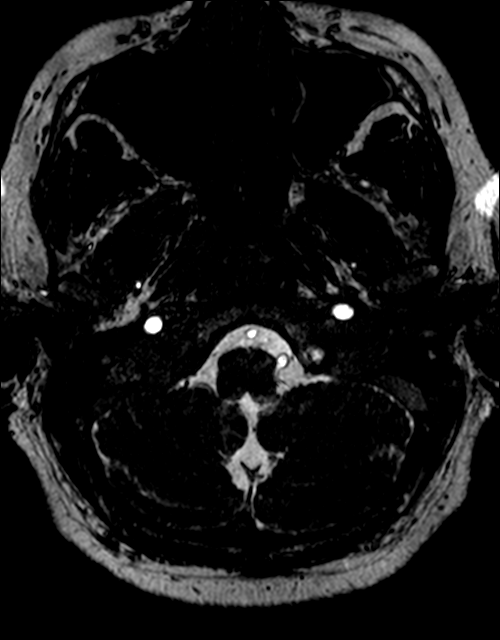
[im 18/36]
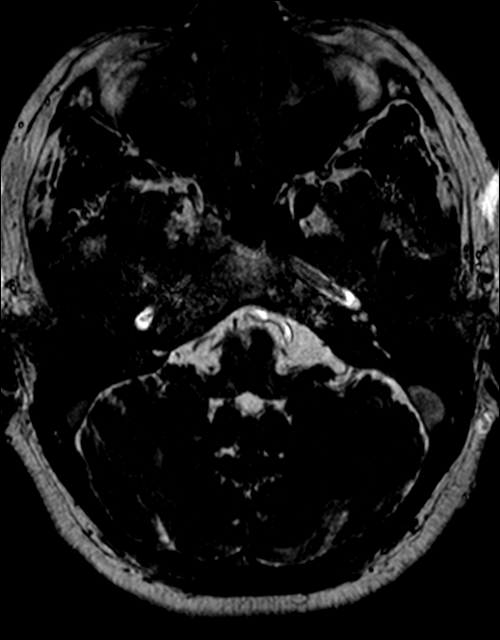
[im 27/36]
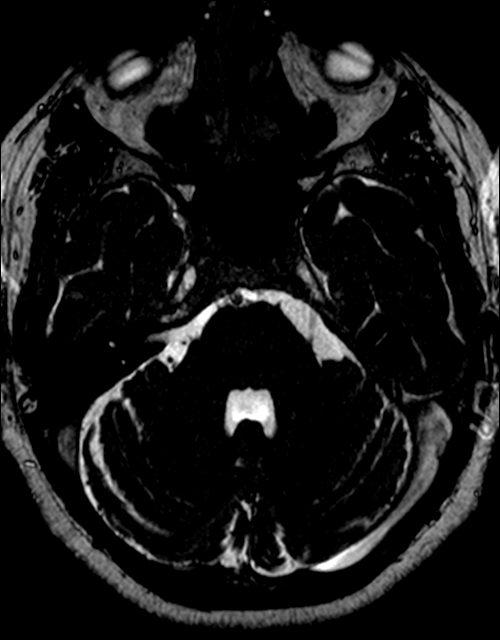
[im 36/36]
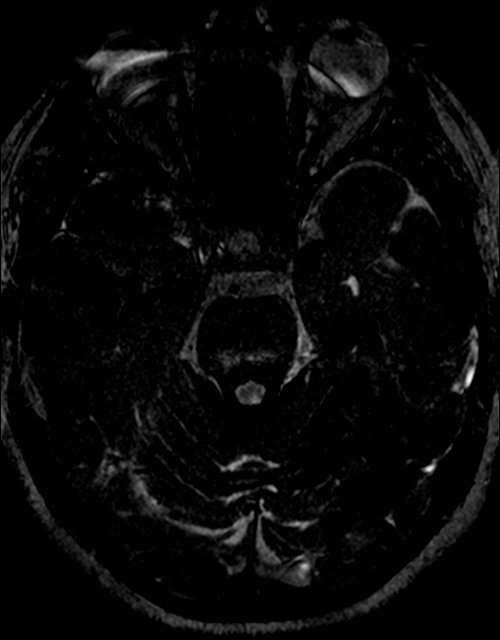

[Series 11: T1 post-contrast · coronal · 3.0mm · 0.35mm/px · 1 of 11 slices shown (1 of 2)]
[im 1/11]
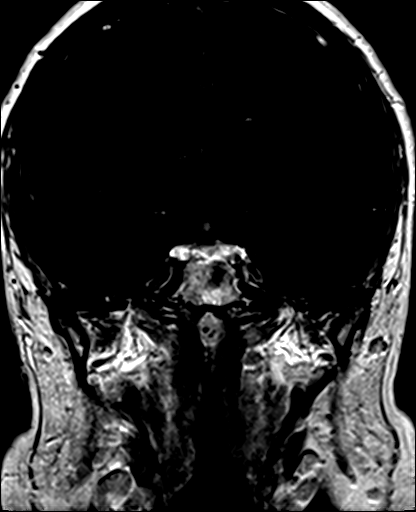

[Series 12: T1 post-contrast · axial · 3.0mm · 0.35mm/px · 1 of 11 slices shown (2 of 2)]
[im 1/11]
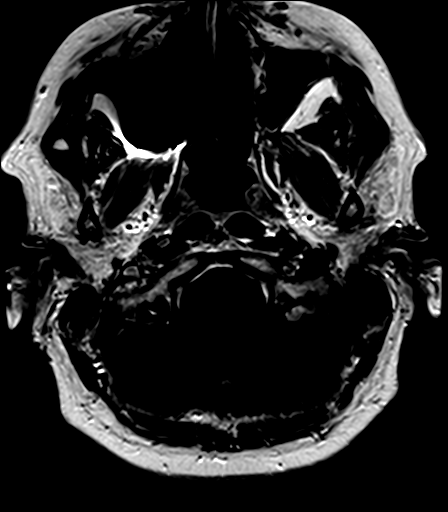

[35 of 48 positions shown; findings below may reference images not displayed]

FINDINGS: No evidence for acute infarction, hemorrhage, mass lesion,
hydrocephalus, or extra-axial fluid. Normal cerebral volume. No
white matter disease.

Thin-section imaging through the posterior fossa was performed pre
and postcontrast. No evidence for vestibular schwannoma or posterior
fossa mass. No abnormal labyrinthine enhancement. Minor changes of
mucosal thickening or slight effusion are suspected in the LEFT
mastoid air cells. No abnormal mucosal enhancement. Unremarkable
nasopharynx.

Post infusion imaging through the entire brain demonstrates no
abnormal enhancement of the brain or meninges. Major dural venous
sinuses are patent.

Mild chronic sinus disease. Negative orbits. No midline
abnormalities. No osseous findings. Extracranial soft tissues
unremarkable.
IMPRESSION: Negative exam.  No central cause for hearing loss is identified.

Asymmetric mucosal thickening and/or small effusion have LEFT
mastoid, without abnormal enhancement.

## 2017-11-24 ENCOUNTER — Ambulatory Visit (INDEPENDENT_AMBULATORY_CARE_PROVIDER_SITE_OTHER): Admitting: Family Medicine

## 2017-11-24 ENCOUNTER — Encounter: Payer: Self-pay | Admitting: Family Medicine

## 2017-11-24 VITALS — BP 128/84 | HR 82 | Temp 98.2°F | Ht 61.5 in | Wt 154.0 lb

## 2017-11-24 DIAGNOSIS — R3915 Urgency of urination: Secondary | ICD-10-CM

## 2017-11-24 DIAGNOSIS — B373 Candidiasis of vulva and vagina: Secondary | ICD-10-CM | POA: Diagnosis not present

## 2017-11-24 DIAGNOSIS — R300 Dysuria: Secondary | ICD-10-CM | POA: Diagnosis not present

## 2017-11-24 DIAGNOSIS — B3731 Acute candidiasis of vulva and vagina: Secondary | ICD-10-CM

## 2017-11-24 LAB — MICROSCOPIC EXAMINATION
Bacteria, UA: NONE SEEN
RENAL EPITHEL UA: NONE SEEN /HPF
WBC, UA: >30 /hpf — AB (ref 0–?)

## 2017-11-24 LAB — URINALYSIS, COMPLETE
BILIRUBIN UA: NEGATIVE
Glucose, UA: NEGATIVE
Ketones, UA: NEGATIVE
Nitrite, UA: NEGATIVE
PH UA: 5.5 (ref 5.0–7.5)
Specific Gravity, UA: 1.025 (ref 1.005–1.030)
Urobilinogen, Ur: 0.2 mg/dL (ref 0.2–1.0)

## 2017-11-24 MED ORDER — SULFAMETHOXAZOLE-TRIMETHOPRIM 800-160 MG PO TABS
1.0000 | ORAL_TABLET | Freq: Two times a day (BID) | ORAL | 0 refills | Status: AC
Start: 2017-11-24 — End: 2017-11-27

## 2017-11-24 MED ORDER — FLUCONAZOLE 150 MG PO TABS: 150.0000 mg | ORAL_TABLET | Freq: Once | ORAL | 0 refills | Status: AC

## 2017-11-24 NOTE — Progress Notes (Signed)
Subjective: CC: urinary symptoms PCP: Elenora Gamma, MD [id number] Sheri Mason is a 57 y.o. female presenting to clinic today for:  1.Urinary symptoms Patient reports a 1 day h/o urinary frequency and dysuria .  She notes that sometimes vaginal yeast infections can feel similar.  She is not sexually active.  Denies urgency, hematuria, fevers, chills, abdominal pain, nausea, vomiting, back pain, vaginal discharge.  Patient has used nothing for symptoms.  Patient does report a h/o frequent or recurrent UTIs.  She notes that she was previously seen by urology was on a prophylactic medication for some time.  She has not had a urinary tract infection in a while.   ROS: Per HPI  Allergies  Allergen Reactions  . Penicillins Rash   History reviewed. No pertinent past medical history. No current outpatient medications on file. Social History   Socioeconomic History  . Marital status: Single    Spouse name: Not on file  . Number of children: Not on file  . Years of education: Not on file  . Highest education level: Not on file  Social Needs  . Financial resource strain: Not on file  . Food insecurity - worry: Not on file  . Food insecurity - inability: Not on file  . Transportation needs - medical: Not on file  . Transportation needs - non-medical: Not on file  Occupational History  . Not on file  Tobacco Use  . Smoking status: Former Smoker  Substance and Sexual Activity  . Alcohol use: No  . Drug use: No  . Sexual activity: Not on file  Other Topics Concern  . Not on file  Social History Narrative  . Not on file   Family History  Problem Relation Age of Onset  . Hypertension Mother   . Arthritis Mother   . Diabetes Mother   . Hypertension Father   . Hyperlipidemia Father   . Heart disease Father     Objective: Office vital signs reviewed. BP 128/84   Pulse 82   Temp 98.2 F (36.8 C) (Oral)   Ht 5' 1.5" (1.562 m)   Wt 154 lb (69.9 kg)   BMI 28.63 kg/m    Physical Examination:  General: Awake, alert, well nourished, well appearing. No acute distress GU: no CVA TTP.   Assessment/ Plan: 57 y.o. female   1. Yeast vaginitis Urinalysis with 2+ leukocytes.  Urine microscopy actually demonstrated quite a few hyphae and white blood cells.  Diflucan 150 mg p.o. today and may repeat in 3 days if needed.  2. Urgency of urination Urinalysis and urine microscopy did not demonstrate substantial bacteria within the urine sample.  However, given her symptoms and the amount of white blood cells, I have sent this for culture.  Septra DS p.o. twice daily for the next 3 days prescribed.  Home care instructions reviewed with patient.  Reasons for reevaluation discussed with patient.  She was good understanding will follow-up as needed. - Urinalysis, Complete - Urine Culture; Future - Urine Culture  3. Burning with urination - Urinalysis, Complete - Urine Culture; Future - Urine Culture   Orders Placed This Encounter  Procedures  . Urine Culture    Standing Status:   Future    Number of Occurrences:   1    Standing Expiration Date:   12/22/2017  . Urinalysis, Complete   Meds ordered this encounter  Medications  . fluconazole (DIFLUCAN) 150 MG tablet    Sig: Take 1 tablet (150 mg total) by mouth  once for 1 dose. May repeat in 3 days if persistent symptoms.    Dispense:  2 tablet    Refill:  0  . sulfamethoxazole-trimethoprim (BACTRIM DS) 800-160 MG tablet    Sig: Take 1 tablet by mouth 2 (two) times daily for 3 days.    Dispense:  6 tablet    Refill:  0     Markas Aldredge Hulen SkainsM Azaela Caracci, DO [facility] [phone]

## 2017-11-24 NOTE — Patient Instructions (Signed)
Your urine demonstrated many white blood cells and findings consistent with yeast infection.  It is difficult to tell given the substantial amount of white blood cells whether or not this is a true urinary tract infection or not.  This reason I have given you a 3-day supply of antibiotic to cover for possible UTI.  I have sent your urine for culture to further evaluate.  I have also prescribed you 2 Diflucan tablets to use to cover for yeast infection.  If any of your symptoms worsen or you develop any other signs of more severe infection like fevers, nausea, vomiting, please seek immediate medical attention.  Otherwise, I will contact you next week with the results of your urine culture.

## 2017-11-25 LAB — URINE CULTURE

## 2018-05-31 ENCOUNTER — Ambulatory Visit (INDEPENDENT_AMBULATORY_CARE_PROVIDER_SITE_OTHER): Admitting: Pediatrics

## 2018-05-31 ENCOUNTER — Encounter: Payer: Self-pay | Admitting: Pediatrics

## 2018-05-31 VITALS — BP 122/71 | HR 73 | Temp 98.2°F | Ht 61.5 in | Wt 127.0 lb

## 2018-05-31 DIAGNOSIS — R399 Unspecified symptoms and signs involving the genitourinary system: Secondary | ICD-10-CM | POA: Diagnosis not present

## 2018-05-31 DIAGNOSIS — N3090 Cystitis, unspecified without hematuria: Secondary | ICD-10-CM | POA: Diagnosis not present

## 2018-05-31 DIAGNOSIS — N390 Urinary tract infection, site not specified: Secondary | ICD-10-CM

## 2018-05-31 MED ORDER — NITROFURANTOIN MONOHYD MACRO 100 MG PO CAPS: ORAL_CAPSULE | ORAL | 1 refills | Status: AC

## 2018-05-31 MED ORDER — SULFAMETHOXAZOLE-TRIMETHOPRIM 800-160 MG PO TABS
1.0000 | ORAL_TABLET | Freq: Two times a day (BID) | ORAL | 0 refills | Status: DC
Start: 2018-05-31 — End: 2018-05-31

## 2018-05-31 MED ORDER — NITROFURANTOIN MONOHYD MACRO 100 MG PO CAPS: ORAL_CAPSULE | ORAL | 1 refills | Status: DC

## 2018-05-31 MED ORDER — SULFAMETHOXAZOLE-TRIMETHOPRIM 800-160 MG PO TABS: 1.0000 | ORAL_TABLET | Freq: Two times a day (BID) | ORAL | 0 refills | Status: AC

## 2018-05-31 NOTE — Progress Notes (Signed)
  Subjective:   Patient ID: Sheri Mason, female    DOB: [birth year], 57 y.o.   MRN: [id number] CC: Urinary Tract Infection  HPI: Sheri Mason is a 57 y.o. female   Dysuria started about a week ago.  Every time she voids she has burning.  Some lower abdominal discomfort.  Recently restarted sexual activity.  She thinks symptoms started soon after that.  She asked about prophylaxis.  She has been prone to UTIs in the past.  Recent stomach surgery/tummy tuck per pt. has been healing well but still has some mild abdominal tenderness.  Wears an abdominal binder.  Relevant past medical, surgical, family and social history reviewed. Allergies and medications reviewed and updated. Social History   Tobacco Use  Smoking Status Former Smoker   ROS: Per HPI   Objective:    BP 122/71   Pulse 73   Temp 98.2 F (36.8 C) (Oral)   Ht 5' 1.5" (1.562 m)   Wt 127 lb (57.6 kg)   BMI 23.61 kg/m   Wt Readings from Last 3 Encounters:  05/31/18 127 lb (57.6 kg)  11/24/17 154 lb (69.9 kg)  09/10/15 170 lb 12.8 oz (77.5 kg)    Gen: NAD, alert, cooperative with exam, NCAT EYES: EOMI, no conjunctival injection, or no icterus CV: NRRR, normal S1/S2, no murmur, distal pulses 2+ b/l Resp: CTABL, no wheezes, normal WOB Abd: +BS, soft, mildly tender, ND. no CVA tenderness. Ext: No edema, warm Neuro: Alert and oriented MSK: normal muscle bulk  Assessment & Plan:  Sheri Mason was seen today for urinary tract infection.  Diagnoses and all orders for this visit:  Cystitis Treat with below, follow-up final urine culture. -     sulfamethoxazole-trimethoprim (BACTRIM DS) 800-160 MG tablet; Take 1 tablet by mouth 2 (two) times daily.  UTI symptoms -     Cancel: Urinalysis -     Urine Culture; Future -     Urine Culture  Frequent UTI Take 1 tab postcoitally.   -     nitrofurantoin, macrocrystal-monohydrate, (MACROBID) 100 MG capsule; Use as directed   Follow up plan: Return in about 3 months (around  08/31/2018) for well exam.   Rex Krasarol Davita Sublett, MD [facility]

## 2018-06-01 ENCOUNTER — Encounter: Payer: Self-pay | Admitting: Pediatrics

## 2018-06-03 LAB — URINE CULTURE

## 2019-02-14 ENCOUNTER — Telehealth: Payer: Self-pay | Admitting: Family Medicine
